# Patient Record
Sex: Female | Born: 1980 | Race: White | Hispanic: No | Smoking: Never smoker
Health system: Southern US, Community
[De-identification: ages and names within clinical notes are randomized; demographics above are authoritative.]

## PROBLEM LIST (undated history)

## (undated) DIAGNOSIS — N920 Excessive and frequent menstruation with regular cycle: Secondary | ICD-10-CM

## (undated) DIAGNOSIS — D649 Anemia, unspecified: Secondary | ICD-10-CM

## (undated) DIAGNOSIS — A599 Trichomoniasis, unspecified: Secondary | ICD-10-CM

## (undated) DIAGNOSIS — A749 Chlamydial infection, unspecified: Secondary | ICD-10-CM

## (undated) HISTORY — DX: Anemia, unspecified: D64.9

## (undated) HISTORY — DX: Excessive and frequent menstruation with regular cycle: N92.0

## (undated) HISTORY — DX: Trichomoniasis, unspecified: A59.9

## (undated) HISTORY — DX: Chlamydial infection, unspecified: A74.9

---

## 2000-05-04 ENCOUNTER — Encounter: Payer: Self-pay | Admitting: *Deleted

## 2000-05-04 ENCOUNTER — Ambulatory Visit (HOSPITAL_COMMUNITY): Admission: RE | Admit: 2000-05-04 | Discharge: 2000-05-04 | Payer: Self-pay | Admitting: *Deleted

## 2000-08-16 ENCOUNTER — Inpatient Hospital Stay (HOSPITAL_COMMUNITY): Admission: RE | Admit: 2000-08-16 | Discharge: 2000-08-18 | Payer: Self-pay | Admitting: *Deleted

## 2002-05-17 ENCOUNTER — Emergency Department (HOSPITAL_COMMUNITY): Admission: EM | Admit: 2002-05-17 | Discharge: 2002-05-17 | Payer: Self-pay | Admitting: Emergency Medicine

## 2004-03-19 ENCOUNTER — Emergency Department (HOSPITAL_COMMUNITY): Admission: EM | Admit: 2004-03-19 | Discharge: 2004-03-19 | Payer: Self-pay | Admitting: Emergency Medicine

## 2005-01-19 ENCOUNTER — Emergency Department (HOSPITAL_COMMUNITY): Admission: EM | Admit: 2005-01-19 | Discharge: 2005-01-19 | Payer: Self-pay | Admitting: Emergency Medicine

## 2006-04-25 ENCOUNTER — Emergency Department (HOSPITAL_COMMUNITY): Admission: EM | Admit: 2006-04-25 | Discharge: 2006-04-25 | Payer: Self-pay | Admitting: Emergency Medicine

## 2006-12-14 ENCOUNTER — Emergency Department (HOSPITAL_COMMUNITY): Admission: EM | Admit: 2006-12-14 | Discharge: 2006-12-14 | Payer: Self-pay | Admitting: Emergency Medicine

## 2007-01-01 ENCOUNTER — Other Ambulatory Visit: Admission: RE | Admit: 2007-01-01 | Discharge: 2007-01-01 | Payer: Self-pay | Admitting: Obstetrics and Gynecology

## 2007-02-07 ENCOUNTER — Ambulatory Visit (HOSPITAL_COMMUNITY): Admission: RE | Admit: 2007-02-07 | Discharge: 2007-02-07 | Payer: Self-pay | Admitting: Obstetrics & Gynecology

## 2007-02-28 ENCOUNTER — Ambulatory Visit (HOSPITAL_COMMUNITY): Admission: RE | Admit: 2007-02-28 | Discharge: 2007-02-28 | Payer: Self-pay | Admitting: Obstetrics & Gynecology

## 2007-04-11 ENCOUNTER — Ambulatory Visit (HOSPITAL_COMMUNITY): Admission: RE | Admit: 2007-04-11 | Discharge: 2007-04-11 | Payer: Self-pay | Admitting: Obstetrics & Gynecology

## 2007-05-09 ENCOUNTER — Ambulatory Visit (HOSPITAL_COMMUNITY): Admission: RE | Admit: 2007-05-09 | Discharge: 2007-05-09 | Payer: Self-pay | Admitting: Obstetrics & Gynecology

## 2007-06-06 ENCOUNTER — Ambulatory Visit (HOSPITAL_COMMUNITY): Admission: RE | Admit: 2007-06-06 | Discharge: 2007-06-06 | Payer: Self-pay | Admitting: Obstetrics & Gynecology

## 2007-06-26 ENCOUNTER — Ambulatory Visit: Payer: Self-pay | Admitting: *Deleted

## 2007-06-26 ENCOUNTER — Inpatient Hospital Stay (HOSPITAL_COMMUNITY): Admission: AD | Admit: 2007-06-26 | Discharge: 2007-06-27 | Payer: Self-pay | Admitting: Obstetrics & Gynecology

## 2007-07-14 ENCOUNTER — Emergency Department (HOSPITAL_COMMUNITY): Admission: EM | Admit: 2007-07-14 | Discharge: 2007-07-14 | Payer: Self-pay | Admitting: Emergency Medicine

## 2007-08-10 ENCOUNTER — Emergency Department (HOSPITAL_COMMUNITY): Admission: EM | Admit: 2007-08-10 | Discharge: 2007-08-10 | Payer: Self-pay | Admitting: Emergency Medicine

## 2008-08-23 IMAGING — CR DG LUMBAR SPINE COMPLETE 4+V
5 series · 5 of 5 positions shown · non-contrast
Comparison: None.

CLINICAL DATA: Low back pain.

LUMBAR SPINE - COMPLETE 4+ VIEW

[view not recorded (1 of 5)]
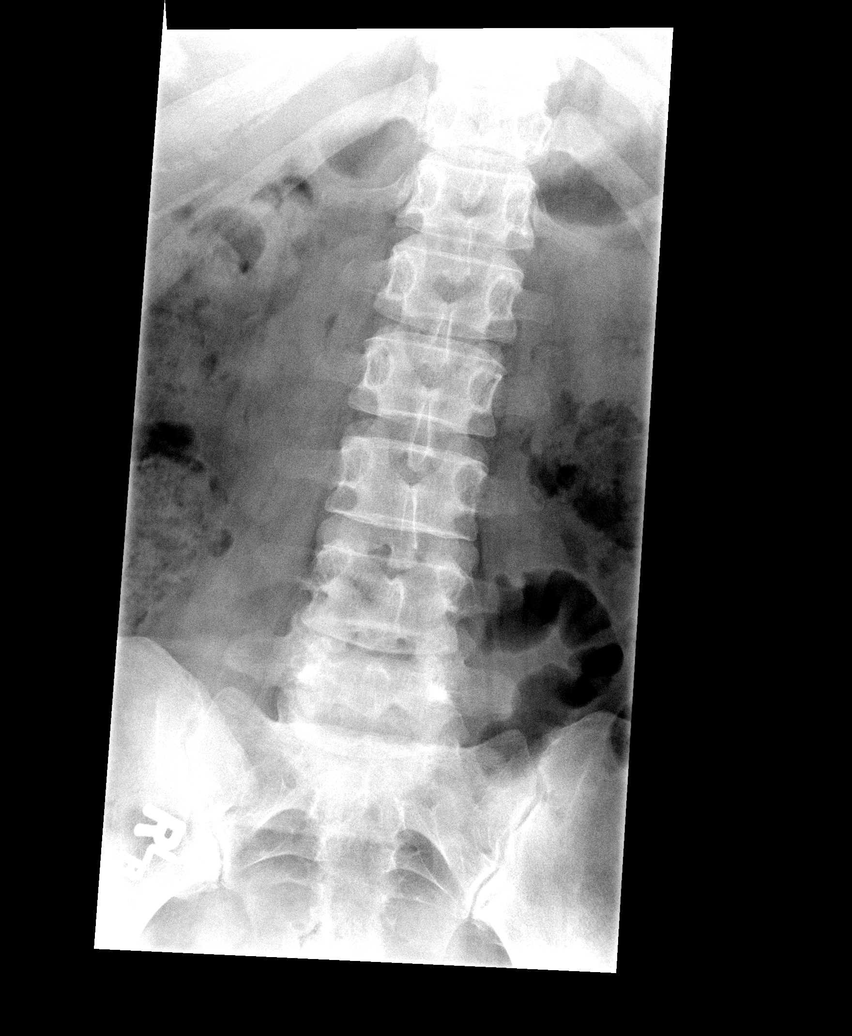

[view not recorded (2 of 5)]
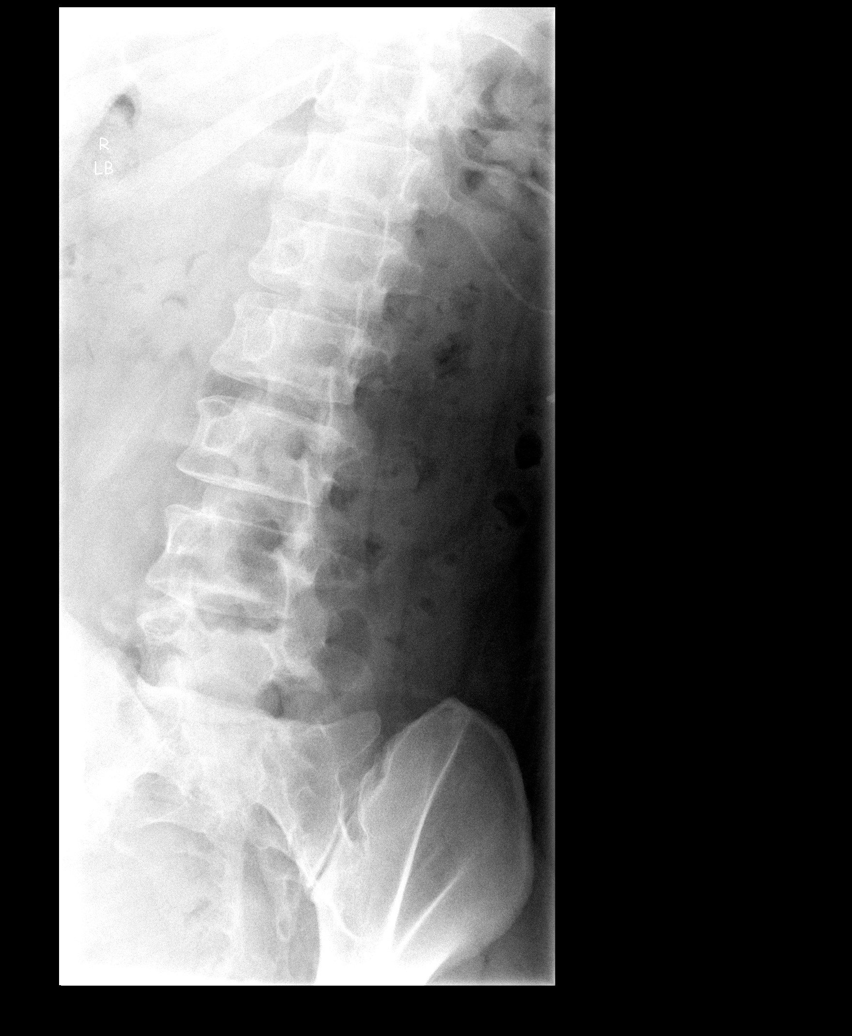

[view not recorded (3 of 5)]
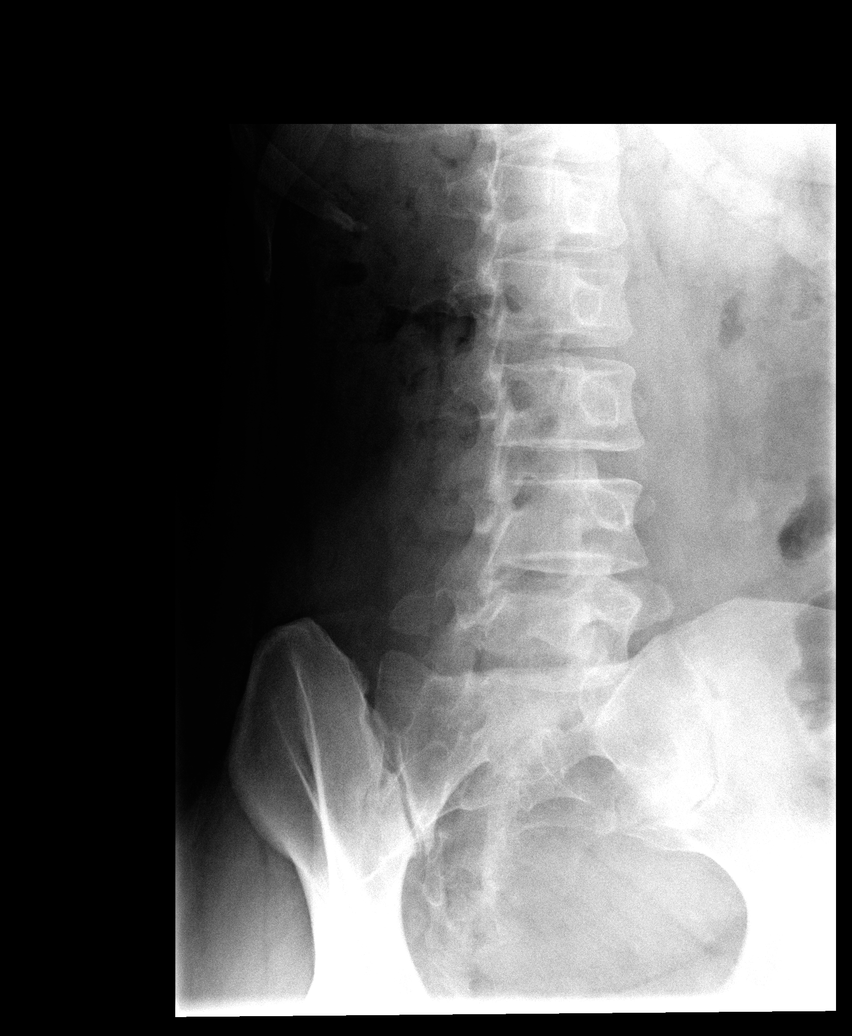

[view not recorded (4 of 5)]
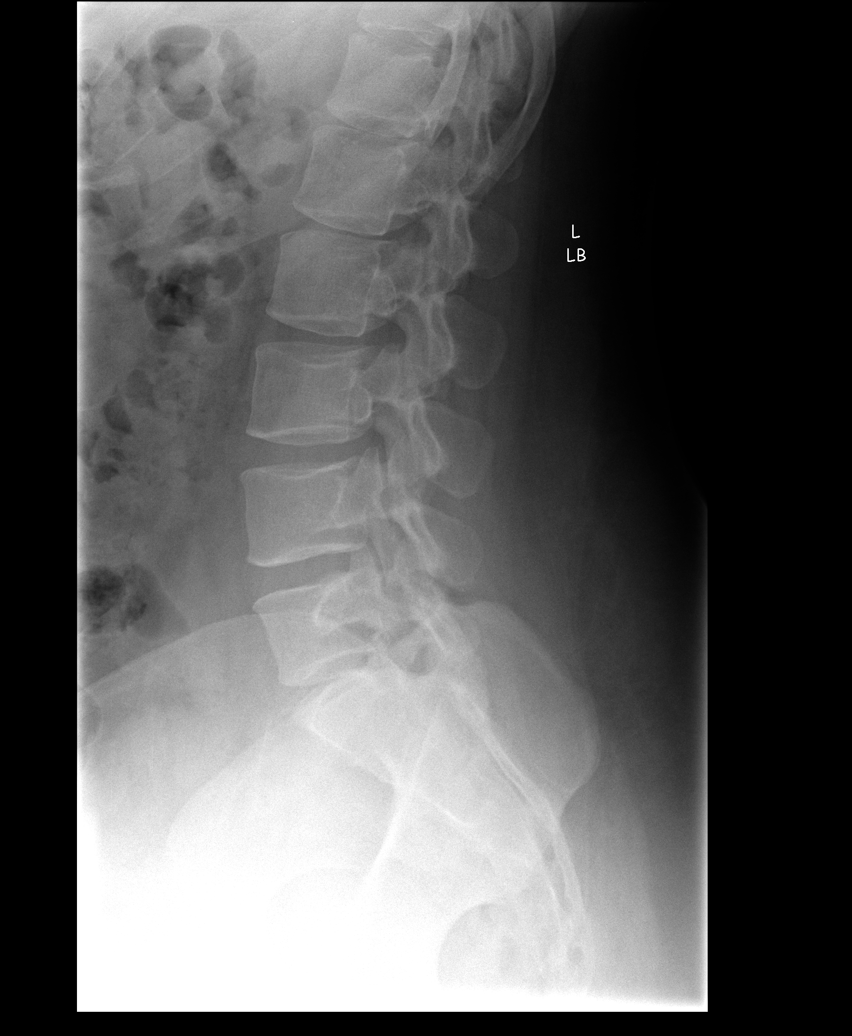

[view not recorded (5 of 5)]
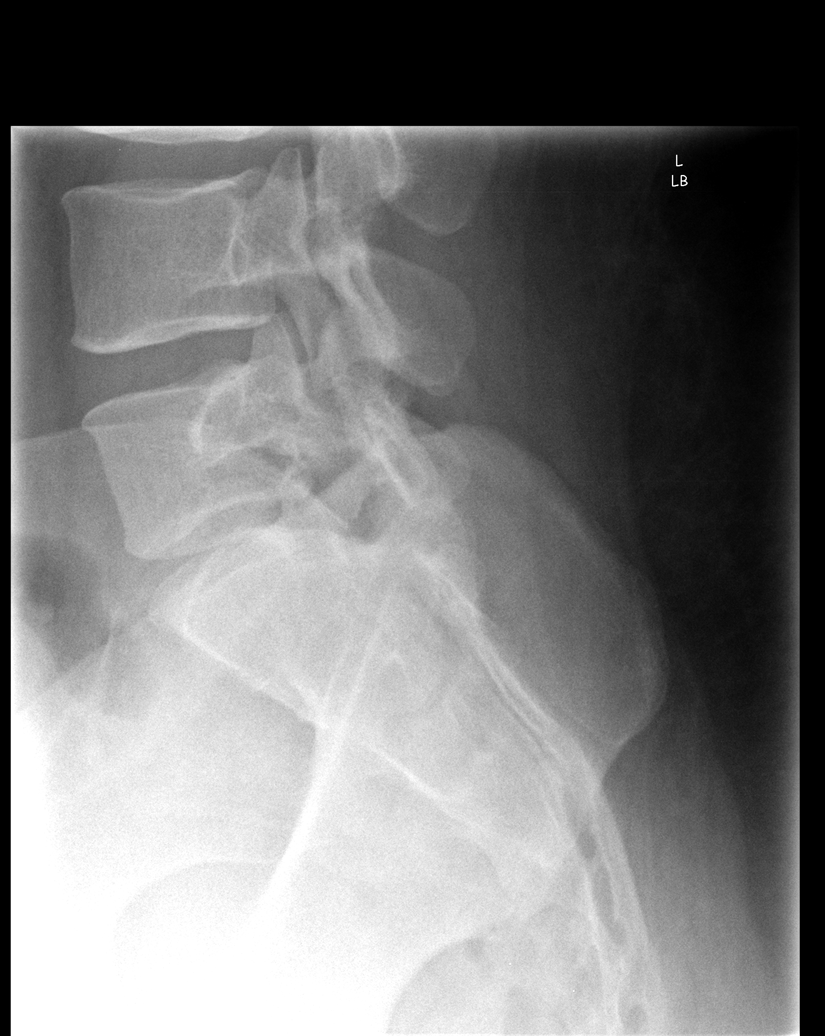

[5 of 5 positions shown; findings below may reference images not displayed]

FINDINGS: There is no evidence of lumbar spine fracture.  Alignment
is normal. No pars defects. Intervertebral disc spaces are
maintained.
IMPRESSION: Negative.

## 2008-11-03 ENCOUNTER — Other Ambulatory Visit: Admission: RE | Admit: 2008-11-03 | Discharge: 2008-11-03 | Payer: Self-pay | Admitting: Obstetrics and Gynecology

## 2010-01-24 ENCOUNTER — Encounter: Payer: Self-pay | Admitting: Obstetrics & Gynecology

## 2010-08-24 ENCOUNTER — Other Ambulatory Visit (HOSPITAL_COMMUNITY)
Admission: RE | Admit: 2010-08-24 | Discharge: 2010-08-24 | Disposition: A | Discharge status: Home / Self Care | Payer: Medicaid Other | Source: Ambulatory Visit | Attending: Obstetrics and Gynecology | Admitting: Obstetrics and Gynecology

## 2010-08-24 ENCOUNTER — Other Ambulatory Visit: Payer: Self-pay | Admitting: Adult Health

## 2010-08-24 DIAGNOSIS — Z01419 Encounter for gynecological examination (general) (routine) without abnormal findings: Secondary | ICD-10-CM | POA: Insufficient documentation

## 2010-08-24 DIAGNOSIS — Z113 Encounter for screening for infections with a predominantly sexual mode of transmission: Secondary | ICD-10-CM | POA: Insufficient documentation

## 2010-09-30 LAB — CBC
Hemoglobin: 11.1 — ABNORMAL LOW
MCHC: 33.5
MCV: 79.4
RBC: 4.16
RDW: 13.9

## 2010-10-01 LAB — BASIC METABOLIC PANEL
Calcium: 9
GFR calc Af Amer: >60
GFR calc non Af Amer: >60
Glucose, Bld: 93
Sodium: 140

## 2010-10-01 LAB — CBC
Hemoglobin: 10.4 — ABNORMAL LOW
RBC: 4.08
RDW: 15.3
WBC: 9.9

## 2010-10-01 LAB — URINALYSIS, ROUTINE W REFLEX MICROSCOPIC
Glucose, UA: NEGATIVE
Hgb urine dipstick: NEGATIVE
Specific Gravity, Urine: 1.015
pH: 7

## 2010-10-01 LAB — POCT CARDIAC MARKERS
CKMB, poc: 1.1
Troponin i, poc: <0.05

## 2010-10-01 LAB — URINE MICROSCOPIC-ADD ON

## 2010-10-01 LAB — DIFFERENTIAL
Basophils Absolute: 0
Lymphocytes Relative: 20
Lymphs Abs: 2
Monocytes Absolute: 0.7
Neutro Abs: 7.1

## 2013-12-17 ENCOUNTER — Other Ambulatory Visit: Payer: Self-pay | Admitting: Obstetrics & Gynecology

## 2013-12-17 ENCOUNTER — Telehealth: Payer: Self-pay | Admitting: Obstetrics & Gynecology

## 2013-12-17 NOTE — Telephone Encounter (Signed)
Pt states last Tuesday had a boil to come up at vaginal area, it is now draining a yellowish discharge. Pt was given an appt for tomorrow with Rodena PietyFran Cresenzo, CNM for 11:30. Pt verbalized understanding.

## 2013-12-18 ENCOUNTER — Ambulatory Visit (INDEPENDENT_AMBULATORY_CARE_PROVIDER_SITE_OTHER): Payer: Self-pay | Admitting: Advanced Practice Midwife

## 2013-12-18 ENCOUNTER — Encounter: Payer: Self-pay | Admitting: Advanced Practice Midwife

## 2013-12-18 VITALS — BP 136/64 | Ht 67.0 in | Wt 272.0 lb

## 2013-12-18 DIAGNOSIS — N898 Other specified noninflammatory disorders of vagina: Secondary | ICD-10-CM

## 2013-12-18 DIAGNOSIS — N7689 Other specified inflammation of vagina and vulva: Secondary | ICD-10-CM

## 2013-12-18 NOTE — Progress Notes (Signed)
[facility] Visit  Patient name: Anna Edwards MRN [id number]  Date of birth: 1980/08/19  CC & HPI:  Anna Edwards is a 33 y.o. Caucasian female presenting today for c/o vaginal boil since last week.  2 days ago, it ruptured, feels much better  Pertinent History Reviewed:  Medical & Surgical Hx:   History reviewed. No pertinent past medical history. History reviewed. No pertinent past surgical history. Medications: Reviewed & Updated - see associated section Social History: Reviewed -  reports that she has never smoked. She does not have any smokeless tobacco history on file.  Objective Findings:  Vitals: BP 136/64 mmHg  Ht 5\' 7"  (1.702 m)  Wt 272 lb (123.378 kg)  BMI 42.59 kg/m2  LMP 11/26/2013  Physical Examination: General appearance - alert, well appearing, and in no distress Mental status - alert, oriented to person, place, and time Pelvic:  Left vulva, evidence of ruptured boil.  No erythema, no more drainage  No results found for this or any previous visit (from the past 24 hour(s)).   Assessment & Plan:  A:   Ruptured vaginal boil, healing well P:  Discussed treatment options if happens again   F/U prn   CRESENZO-DISHMAN,FRANCES CNM 12/18/2013 11:56 AM

## 2013-12-24 ENCOUNTER — Other Ambulatory Visit: Payer: Self-pay | Admitting: Obstetrics & Gynecology

## 2014-01-07 ENCOUNTER — Other Ambulatory Visit (HOSPITAL_COMMUNITY)
Admission: RE | Admit: 2014-01-07 | Discharge: 2014-01-07 | Disposition: A | Discharge status: Home / Self Care | Payer: Medicaid Other | Source: Ambulatory Visit | Attending: Adult Health | Admitting: Adult Health

## 2014-01-07 ENCOUNTER — Other Ambulatory Visit: Payer: Self-pay | Admitting: Women's Health

## 2014-01-07 ENCOUNTER — Ambulatory Visit (INDEPENDENT_AMBULATORY_CARE_PROVIDER_SITE_OTHER): Payer: Medicaid Other | Admitting: Adult Health

## 2014-01-07 ENCOUNTER — Encounter: Payer: Self-pay | Admitting: Adult Health

## 2014-01-07 VITALS — BP 126/70 | HR 78 | Ht 67.25 in | Wt 272.0 lb

## 2014-01-07 DIAGNOSIS — N920 Excessive and frequent menstruation with regular cycle: Secondary | ICD-10-CM

## 2014-01-07 DIAGNOSIS — Z308 Encounter for other contraceptive management: Secondary | ICD-10-CM

## 2014-01-07 DIAGNOSIS — Z01419 Encounter for gynecological examination (general) (routine) without abnormal findings: Secondary | ICD-10-CM | POA: Insufficient documentation

## 2014-01-07 DIAGNOSIS — Z1151 Encounter for screening for human papillomavirus (HPV): Secondary | ICD-10-CM | POA: Diagnosis present

## 2014-01-07 DIAGNOSIS — D500 Iron deficiency anemia secondary to blood loss (chronic): Secondary | ICD-10-CM

## 2014-01-07 DIAGNOSIS — Z113 Encounter for screening for infections with a predominantly sexual mode of transmission: Secondary | ICD-10-CM | POA: Insufficient documentation

## 2014-01-07 DIAGNOSIS — Z3009 Encounter for other general counseling and advice on contraception: Secondary | ICD-10-CM

## 2014-01-07 DIAGNOSIS — D649 Anemia, unspecified: Secondary | ICD-10-CM | POA: Insufficient documentation

## 2014-01-07 DIAGNOSIS — Z30011 Encounter for initial prescription of contraceptive pills: Secondary | ICD-10-CM

## 2014-01-07 HISTORY — DX: Anemia, unspecified: D64.9

## 2014-01-07 HISTORY — DX: Excessive and frequent menstruation with regular cycle: N92.0

## 2014-01-07 MED ORDER — NORETHIN ACE-ETH ESTRAD-FE 1-20 MG-MCG(24) PO CHEW: 1.0000 | CHEWABLE_TABLET | Freq: Every day | ORAL | Status: DC

## 2014-01-07 NOTE — Patient Instructions (Signed)
Menorrhagia Menorrhagia is the medical term for when your menstrual periods are heavy or last longer than usual. With menorrhagia, every period you have may cause enough blood loss and cramping that you are unable to maintain your usual activities. CAUSES  In some cases, the cause of heavy periods is unknown, but a number of conditions may cause menorrhagia. Common causes include:  A problem with the hormone-producing thyroid gland (hypothyroid).  Noncancerous growths in the uterus (polyps or fibroids).  An imbalance of the estrogen and progesterone hormones.  One of your ovaries not releasing an egg during one or more months.  Side effects of having an intrauterine device (IUD).  Side effects of some medicines, such as anti-inflammatory medicines or blood thinners.  A bleeding disorder that stops your blood from clotting normally. SIGNS AND SYMPTOMS  During a normal period, bleeding lasts between 4 and 8 days. Signs that your periods are too heavy include:  You routinely have to change your pad or tampon every 1 or 2 hours because it is completely soaked.  You pass blood clots larger than 1 inch (2.5 cm) in size.  You have bleeding for more than 7 days.  You need to use pads and tampons at the same time because of heavy bleeding.  You need to wake up to change your pads or tampons during the night.  You have symptoms of anemia, such as tiredness, fatigue, or shortness of breath. DIAGNOSIS  Your health care provider will perform a physical exam and ask you questions about your symptoms and menstrual history. Other tests may be ordered based on what the health care provider finds during the exam. These tests can include:  Blood tests. Blood tests are used to check if you are pregnant or have hormonal changes, a bleeding or thyroid disorder, low iron levels (anemia), or other problems.  Endometrial biopsy. Your health care provider takes a sample of tissue from the inside of your  uterus to be examined under a microscope.  Pelvic ultrasound. This test uses sound waves to make a picture of your uterus, ovaries, and vagina. The pictures can show if you have fibroids or other growths.  Hysteroscopy. For this test, your health care provider will use a small telescope to look inside your uterus. Based on the results of your initial tests, your health care provider may recommend further testing. TREATMENT  Treatment may not be needed. If it is needed, your health care provider may recommend treatment with one or more medicines first. If these do not reduce bleeding enough, a surgical treatment might be an option. The best treatment for you will depend on:   Whether you need to prevent pregnancy.  Your desire to have children in the future.  The cause and severity of your bleeding.  Your opinion and personal preference.  Medicines for menorrhagia may include:  Birth control methods that use hormones. These include the pill, skin patch, vaginal ring, shots that you get every 3 months, hormonal IUD, and implant. These treatments reduce bleeding during your menstrual period.  Medicines that thicken blood and slow bleeding.  Medicines that reduce swelling, such as ibuprofen.  Medicines that contain a synthetic hormone called progestin.   Medicines that make the ovaries stop working for a short time.  You may need surgical treatment for menorrhagia if the medicines are unsuccessful. Treatment options include:  Dilation and curettage (D&C). In this procedure, your health care provider opens (dilates) your cervix and then scrapes or suctions tissue from   the lining of your uterus to reduce menstrual bleeding.  Operative hysteroscopy. This procedure uses a tiny tube with a light (hysteroscope) to view your uterine cavity and can help in the surgical removal of a polyp that may be causing heavy periods.  Endometrial ablation. Through various techniques, your health care  provider permanently destroys the entire lining of your uterus (endometrium). After endometrial ablation, most women have little or no menstrual flow. Endometrial ablation reduces your ability to become pregnant.  Endometrial resection. This surgical procedure uses an electrosurgical wire loop to remove the lining of the uterus. This procedure also reduces your ability to become pregnant.  Hysterectomy. Surgical removal of the uterus and cervix is a permanent procedure that stops menstrual periods. Pregnancy is not possible after a hysterectomy. This procedure requires anesthesia and hospitalization. HOME CARE INSTRUCTIONS   Only take over-the-counter or prescription medicines as directed by your health care provider. Take prescribed medicines exactly as directed. Do not change or switch medicines without consulting your health care provider.  Take any prescribed iron pills exactly as directed by your health care provider. Long-term heavy bleeding may result in low iron levels. Iron pills help replace the iron your body lost from heavy bleeding. Iron may cause constipation. If this becomes a problem, increase the bran, fruits, and roughage in your diet.  Do not take aspirin or medicines that contain aspirin 1 week before or during your menstrual period. Aspirin may make the bleeding worse.  If you need to change your sanitary pad or tampon more than once every 2 hours, stay in bed and rest as much as possible until the bleeding stops.  Eat well-balanced meals. Eat foods high in iron. Examples are leafy green vegetables, meat, liver, eggs, and whole grain breads and cereals. Do not try to lose weight until the abnormal bleeding has stopped and your blood iron level is back to normal. SEEK MEDICAL CARE IF:   You soak through a pad or tampon every 1 or 2 hours, and this happens every time you have a period.  You need to use pads and tampons at the same time because you are bleeding so much.  You  need to change your pad or tampon during the night.  You have a period that lasts for more than 8 days.  You pass clots bigger than 1 inch wide.  You have irregular periods that happen more or less often than once a month.  You feel dizzy or faint.  You feel very weak or tired.  You feel short of breath or feel your heart is beating too fast when you exercise.  You have nausea and vomiting or diarrhea while you are taking your medicine.  You have any problems that may be related to the medicine you are taking. SEEK IMMEDIATE MEDICAL CARE IF:   You soak through 4 or more pads or tampons in 2 hours.  You have any bleeding while you are pregnant. MAKE SURE YOU:   Understand these instructions.  Will watch your condition.  Will get help right away if you are not doing well or get worse. Document Released: 12/20/2004 Document Revised: 12/25/2012 Document Reviewed: 06/10/2012 [facility] Patient Information 2015 [place], [place]. This information is not intended to replace advice given to you by your health care provider. Make sure you discuss any questions you have with your health care provider. Start OCs with next period  Take Iron return in 3 months for follow up Physical in 1 year Iron Deficiency  Anemia Anemia is a condition in which there are less red blood cells or hemoglobin in the blood than normal. Hemoglobin is the part of red blood cells that carries oxygen. Iron deficiency anemia is anemia caused by too little iron. It is the most common type of anemia. It may leave you tired and short of breath. CAUSES   Lack of iron in the diet.  Poor absorption of iron, as seen with intestinal disorders.  Intestinal bleeding.  Heavy periods. SIGNS AND SYMPTOMS  Mild anemia may not be noticeable. Symptoms may include:  Fatigue.  Headache.  Pale skin.  Weakness.  Tiredness.  Shortness of breath.  Dizziness.  Cold hands and feet.  Fast or irregular  heartbeat. DIAGNOSIS  Diagnosis requires a thorough evaluation and physical exam by your health care provider. Blood tests are generally used to confirm iron deficiency anemia. Additional tests may be done to find the underlying cause of your anemia. These may include:  Testing for blood in the stool (fecal occult blood test).  A procedure to see inside the colon and rectum (colonoscopy).  A procedure to see inside the esophagus and stomach (endoscopy). TREATMENT  Iron deficiency anemia is treated by correcting the cause of the deficiency. Treatment may involve:  Adding iron-rich foods to your diet.  Taking iron supplements. Pregnant or breastfeeding women need to take extra iron because their normal diet usually does not provide the required amount.  Taking vitamins. Vitamin C improves the absorption of iron. Your health care provider may recommend that you take your iron tablets with a glass of orange juice or vitamin C supplement.  Medicines to make heavy menstrual flow lighter.  Surgery. HOME CARE INSTRUCTIONS   Take iron as directed by your health care provider.  If you cannot tolerate taking iron supplements by mouth, talk to your health care provider about taking them through a vein (intravenously) or an injection into a muscle.  For the best iron absorption, iron supplements should be taken on an empty stomach. If you cannot tolerate them on an empty stomach, you may need to take them with food.  Do not drink milk or take antacids at the same time as your iron supplements. Milk and antacids may interfere with the absorption of iron.  Iron supplements can cause constipation. Make sure to include fiber in your diet to prevent constipation. A stool softener may also be recommended.  Take vitamins as directed by your health care provider.  Eat a diet rich in iron. Foods high in iron include liver, lean beef, whole-grain bread, eggs, dried fruit, and dark green leafy  vegetables. SEEK IMMEDIATE MEDICAL CARE IF:   You faint. If this happens, do not drive. Call your local emergency services (911 in [place] if no other help is available.  You have chest pain.  You feel nauseous or vomit.  You have severe or increased shortness of breath with activity.  You feel weak.  You have a rapid heartbeat.  You have unexplained sweating.  You become light-headed when getting up from a chair or bed. MAKE SURE YOU:   Understand these instructions.  Will watch your condition.  Will get help right away if you are not doing well or get worse. Document Released: 12/18/1999 Document Revised: 12/25/2012 Document Reviewed: 08/27/2012 [facility] Patient Information 2015 [place], [place]. This information is not intended to replace advice given to you by your health care provider. Make sure you discuss any questions you have with your health care provider.

## 2014-01-07 NOTE — Progress Notes (Signed)
Patient ID: Anna Edwards, female   DOB: Mar 07, 1980, 34 y.o.   MRN: [id number] History of Present Illness: Anna Edwards is a 34 year old white female in for pap and physical and complains of heavy periods and clots and tired during period,heavy first 3-4 days and they last 7 days.Has missed work, and this is going on for greater than 6 months.She is using withdrawal and wants birth control.She has family planning medicaid.   Current Medications, Allergies, Past Medical History, Past Surgical History, Family History and Social History were reviewed in [facility] electronic medical record.     Review of Systems: Patient denies any headaches, blurred vision, shortness of breath, chest pain, abdominal pain, problems with bowel movements, urination, or intercourse. No joint pain or mood swings, see HPI for positives.    Physical Exam:BP 126/70 mmHg  Pulse 78  Ht 5' 7.25" (1.708 m)  Wt 272 lb (123.378 kg)  BMI 42.29 kg/m2  LMP 12/19/2015Fingerstick HGB 8.9 General:  Well developed, well nourished, no acute distress Skin:  Warm and dry Neck:  Midline trachea, normal thyroid Lungs; Clear to auscultation bilaterally Breast:  No dominant palpable mass, retraction, or nipple discharge Cardiovascular: Regular rate and rhythm Abdomen:  Soft, non tender, no hepatosplenomegaly Pelvic:  External genitalia is normal in appearance.  The vagina is normal in appearance. The cervix is bulbous.Pap with HPV and GC/CHL performed.  Uterus is felt to be normal size, shape, and contour.  No   adnexal masses or tenderness noted. Extremities:  No swelling or varicosities noted Psych:  No mood changes,alert and cooperative,seems happy Discussed trying OCs to control periods and for birth control and she agrees, will also add Iron.  Impression: Well woman exam with pap Menorrhagia with regular cycles Anemia from chronic blood loss Contraceptive management   Plan: Rx minastrin,disp 1 pack, take 1 daily start  with next period,refilled x 11 Take OTC iron and eat red meat and spinach Return in 3 months for follow up or before if needed Physical in 1 year Review handout on menorrhagia and anemia  Check HIV,RPR,HSV 2

## 2014-01-08 LAB — HIV ANTIBODY (ROUTINE TESTING W REFLEX): HIV 1&2 Ab, 4th Generation: NONREACTIVE

## 2014-01-08 LAB — RPR

## 2014-01-09 ENCOUNTER — Telehealth: Payer: Self-pay | Admitting: Adult Health

## 2014-01-09 LAB — CYTOLOGY - PAP

## 2014-01-09 LAB — HSV 2 ANTIBODY, IGG

## 2014-01-09 MED ORDER — AZITHROMYCIN 500 MG PO TABS: ORAL_TABLET | ORAL | Status: DC

## 2014-01-09 NOTE — Telephone Encounter (Signed)
Pt aware of positive chlamydia, will rx azithromycin 500 mg #2 take 2 po now,No sex, Proof of treatment 2/4 at 4 pm,[facility] sent, Pt will talk with partner and let me know in am if I need to treat or he will go to clinic, he is now in town.She is aware pap was normal with negative HPV and GC and that HIV, RPR and HSV 2 were all negative.

## 2014-01-09 NOTE — Telephone Encounter (Signed)
Left message to call me about pap 

## 2014-01-10 ENCOUNTER — Telehealth: Payer: Self-pay | Admitting: *Deleted

## 2014-01-10 NOTE — Telephone Encounter (Signed)
Pt called with names of partners to be treated for chlamydia, Anna Edwards [birth year] [facility] and chris spivey [birth year] [facility] [place] [phone] will rx azithromycin 500 mg #2 2 po now for each and told pt to tell them that they need to tell other partners that they need to be treated

## 2014-02-06 ENCOUNTER — Ambulatory Visit (INDEPENDENT_AMBULATORY_CARE_PROVIDER_SITE_OTHER): Payer: Self-pay | Admitting: Adult Health

## 2014-02-06 ENCOUNTER — Encounter: Payer: Self-pay | Admitting: Adult Health

## 2014-02-06 VITALS — BP 130/64 | Ht 67.0 in | Wt 275.0 lb

## 2014-02-06 DIAGNOSIS — A749 Chlamydial infection, unspecified: Secondary | ICD-10-CM | POA: Insufficient documentation

## 2014-02-06 NOTE — Patient Instructions (Signed)
Return as scheduled 

## 2014-02-06 NOTE — Progress Notes (Signed)
Subjective:     Patient ID: Anna Edwards, female   DOB: Feb 10, 1980, 34 y.o.   MRN: [id number]  HPI Anna Edwards is a 34 year old white female in for proof of treatment for recent + chlamydia.She and her partner took their meds and she has not had sex.No discharge or discomfort.When she was seen last had menorrhagia and started on OCs and was anemic and is taking OTC iron and feels better.  Review of Systems  All other systems negative Reviewed past medical,surgical, social and family history. Reviewed medications and allergies.     Objective:   Physical Exam BP 130/64 mmHg  Ht 5\' 7"  (1.702 m)  Wt 275 lb (124.739 kg)  BMI 43.06 kg/m2  LMP 01/18/2014   GC/CHL sent, She says she is feeling better, to continue OCs and iron and follow up as scheduled, and she now has only one partner.  Assessment:     Proof of treatment for recent + chlamydia    Plan:    Use condoms Follow up as scheduled GC/CHL sent

## 2014-02-09 LAB — GC/CHLAMYDIA PROBE AMP
Chlamydia trachomatis, NAA: NEGATIVE
NEISSERIA GONORRHOEAE BY PCR: NEGATIVE

## 2014-02-10 ENCOUNTER — Telehealth: Payer: Self-pay | Admitting: Adult Health

## 2014-02-10 NOTE — Telephone Encounter (Signed)
Left message tests negative 

## 2014-02-13 ENCOUNTER — Telehealth: Payer: Self-pay | Admitting: Adult Health

## 2014-02-13 MED ORDER — NORETHIN ACE-ETH ESTRAD-FE 1-20 MG-MCG(24) PO CHEW: 1.0000 | CHEWABLE_TABLET | Freq: Every day | ORAL | Status: DC

## 2014-02-13 NOTE — Telephone Encounter (Signed)
[name] said [facility], [facility], will transfer Rx to there

## 2014-04-10 ENCOUNTER — Ambulatory Visit: Payer: Medicaid Other | Admitting: Adult Health

## 2014-12-18 ENCOUNTER — Other Ambulatory Visit: Payer: Self-pay | Admitting: Adult Health

## 2015-01-13 ENCOUNTER — Ambulatory Visit (INDEPENDENT_AMBULATORY_CARE_PROVIDER_SITE_OTHER): Payer: Medicaid Other | Admitting: Adult Health

## 2015-01-13 ENCOUNTER — Encounter: Payer: Self-pay | Admitting: Adult Health

## 2015-01-13 VITALS — BP 120/80 | HR 80 | Ht 67.0 in | Wt 275.0 lb

## 2015-01-13 DIAGNOSIS — Z309 Encounter for contraceptive management, unspecified: Secondary | ICD-10-CM

## 2015-01-13 DIAGNOSIS — Z01419 Encounter for gynecological examination (general) (routine) without abnormal findings: Secondary | ICD-10-CM

## 2015-01-13 DIAGNOSIS — Z3041 Encounter for surveillance of contraceptive pills: Secondary | ICD-10-CM

## 2015-01-13 MED ORDER — NORETHIN ACE-ETH ESTRAD-FE 1-20 MG-MCG(24) PO CHEW: CHEWABLE_TABLET | ORAL | Status: DC

## 2015-01-13 NOTE — Progress Notes (Signed)
Patient ID: Anna Edwards, female   DOB: Feb 26, 1980, 35 y.o.   MRN: [id number] History of Present Illness: Anna Edwards is a 35 year old white female, in for well woman gyn exam,she had a normal pap 01/07/14. She is happy with her minastrin, periods lighter, occasional BTB.   Current Medications, Allergies, Past Medical History, Past Surgical History, Family History and Social History were reviewed in [facility] electronic medical record.     Review of Systems: Patient denies any headaches, hearing loss, fatigue, blurred vision, shortness of breath, chest pain, abdominal pain, problems with bowel movements, urination, or intercourse. No joint pain or mood swings.See HPI for positives.    Physical Exam:BP 120/80 mmHg  Pulse 80  Ht 5\' 7"  (1.702 m)  Wt 275 lb (124.739 kg)  BMI 43.06 kg/m2  LMP 12/09/2014 General:  Well developed, well nourished, no acute distress Skin:  Warm and dry,tan Neck:  Midline trachea, normal thyroid, good ROM, no lymphadenopathy Lungs; Clear to auscultation bilaterally Breast:  No dominant palpable mass, retraction, or nipple discharge Cardiovascular: Regular rate and rhythm Abdomen:  Soft, non tender, no hepatosplenomegaly Pelvic:  External genitalia is normal in appearance, no lesions.  The vagina is normal in appearance. Urethra has no lesions or masses. The cervix is smooth.  Uterus is felt to be normal size, shape, and contour.  No adnexal masses or tenderness noted.Bladder is non tender, no masses felt. Extremities/musculoskeletal:  No swelling or varicosities noted, no clubbing or cyanosis Psych:  No mood changes, alert and cooperative,seems happy   Impression: Well woman gyn exam no pap Contraceptive management     Plan: GC/CHL sent on urine Check HIV,RPR,HSV2  Refilled minastrin #1 pack take 1 daily with 12 refills Physical in 1 year

## 2015-01-13 NOTE — Patient Instructions (Signed)
Physical in 1 year 

## 2015-01-14 LAB — HIV ANTIBODY (ROUTINE TESTING W REFLEX): HIV Screen 4th Generation wRfx: NONREACTIVE

## 2015-01-14 LAB — RPR: RPR: NONREACTIVE

## 2015-01-14 LAB — GC/CHLAMYDIA PROBE AMP
CHLAMYDIA, DNA PROBE: NEGATIVE
Neisseria gonorrhoeae by PCR: NEGATIVE

## 2015-01-14 LAB — HSV 2 ANTIBODY, IGG

## 2015-12-17 ENCOUNTER — Other Ambulatory Visit: Payer: Self-pay | Admitting: Adult Health

## 2016-01-19 ENCOUNTER — Ambulatory Visit (INDEPENDENT_AMBULATORY_CARE_PROVIDER_SITE_OTHER): Payer: Medicaid Other | Admitting: Adult Health

## 2016-01-19 ENCOUNTER — Encounter: Payer: Self-pay | Admitting: Adult Health

## 2016-01-19 VITALS — BP 120/55 | HR 67 | Ht 67.25 in | Wt 272.5 lb

## 2016-01-19 DIAGNOSIS — Z01419 Encounter for gynecological examination (general) (routine) without abnormal findings: Secondary | ICD-10-CM

## 2016-01-19 DIAGNOSIS — Z308 Encounter for other contraceptive management: Secondary | ICD-10-CM | POA: Diagnosis not present

## 2016-01-19 DIAGNOSIS — Z3009 Encounter for other general counseling and advice on contraception: Secondary | ICD-10-CM

## 2016-01-19 DIAGNOSIS — Z3041 Encounter for surveillance of contraceptive pills: Secondary | ICD-10-CM

## 2016-01-19 MED ORDER — NORETHIN ACE-ETH ESTRAD-FE 1-20 MG-MCG(24) PO CHEW: CHEWABLE_TABLET | ORAL | 12 refills | Status: DC

## 2016-01-19 NOTE — Patient Instructions (Signed)
Physical in 1 year 

## 2016-01-19 NOTE — Progress Notes (Signed)
Patient ID: Anna Edwards, female   DOB: [birth year], 36 y.o.   MRN: [id number] History of Present Illness: Anna Edwards is a 36 year old white female in for well woman gyn exam, she had normal pap with negative HPV 01/07/14. No complaints, has family planning medicaid.    Current Medications, Allergies, Past Medical History, Past Surgical History, Family History and Social History were reviewed in [facility] electronic medical record.     Review of Systems: Patient denies any headaches, hearing loss, fatigue, blurred vision, shortness of breath, chest pain, abdominal pain, problems with bowel movements, urination, or intercourse. No joint pain or mood swings.    Physical Exam:BP (!) 120/55 (BP Location: Left Arm, Patient Position: Sitting, Cuff Size: Large)   Pulse 67   Ht 5' 7.25" (1.708 m)   Wt 272 lb 8 oz (123.6 kg)   LMP 12/14/2015   BMI 42.36 kg/m  General:  Well developed, well nourished, no acute distress Skin:  Warm and dry,tan Neck:  Midline trachea, normal thyroid, good ROM, no lymphadenopathy Lungs; Clear to auscultation bilaterally Breast:  No dominant palpable mass, retraction, or nipple discharge Cardiovascular: Regular rate and rhythm Abdomen:  Soft, non tender, no hepatosplenomegaly Pelvic:  External genitalia is normal in appearance, no lesions.  The vagina is normal in appearance. Urethra has no lesions or masses. The cervix is smooth, CG/CHL obtained.  Uterus is felt to be normal size, shape, and contour.  No adnexal masses or tenderness noted.Bladder is non tender, no masses felt. Extremities/musculoskeletal:  No swelling or varicosities noted, no clubbing or cyanosis Psych:  No mood changes, alert and cooperative,seems happy PHQ 2 score 0.  Impression: 1. Well woman exam with routine gynecological exam   2. Encounter for surveillance of contraceptive pills   3. Family planning       Plan: Check GC/CHL Check HIV and RPR Meds ordered this encounter   Medications  . Norethin Ace-Eth Estrad-FE (MIBELAS 24 FE) 1-20 MG-MCG(24) CHEW    Sig: CHEW ONE TABLET BY MOUTH DAILY.    Dispense:  28 tablet    Refill:  12    Order Specific Question:   Supervising Provider    Answer:   Anna Edwards [[id number]]  Physical in 1 year Pap in 2019

## 2016-01-20 LAB — RPR: RPR Ser Ql: NONREACTIVE

## 2016-01-20 LAB — GC/CHLAMYDIA PROBE AMP
Chlamydia trachomatis, NAA: NEGATIVE
Neisseria gonorrhoeae by PCR: NEGATIVE

## 2016-01-20 LAB — HIV ANTIBODY (ROUTINE TESTING W REFLEX): HIV SCREEN 4TH GENERATION: NONREACTIVE

## 2017-01-12 ENCOUNTER — Emergency Department (HOSPITAL_COMMUNITY)
Admission: EM | Admit: 2017-01-12 | Discharge: 2017-01-12 | Disposition: A | Discharge status: Home / Self Care | Payer: Self-pay | Attending: Emergency Medicine | Admitting: Emergency Medicine

## 2017-01-12 ENCOUNTER — Encounter (HOSPITAL_COMMUNITY): Payer: Self-pay | Admitting: Emergency Medicine

## 2017-01-12 ENCOUNTER — Other Ambulatory Visit: Payer: Self-pay

## 2017-01-12 DIAGNOSIS — D649 Anemia, unspecified: Secondary | ICD-10-CM | POA: Insufficient documentation

## 2017-01-12 DIAGNOSIS — R2231 Localized swelling, mass and lump, right upper limb: Secondary | ICD-10-CM | POA: Insufficient documentation

## 2017-01-12 DIAGNOSIS — Z79899 Other long term (current) drug therapy: Secondary | ICD-10-CM | POA: Insufficient documentation

## 2017-01-12 DIAGNOSIS — L02411 Cutaneous abscess of right axilla: Secondary | ICD-10-CM | POA: Insufficient documentation

## 2017-01-12 MED ORDER — HYDROCODONE-ACETAMINOPHEN 5-325 MG PO TABS: ORAL_TABLET | ORAL | 0 refills | Status: AC

## 2017-01-12 MED ORDER — SULFAMETHOXAZOLE-TRIMETHOPRIM 800-160 MG PO TABS: 1.0000 | ORAL_TABLET | Freq: Two times a day (BID) | ORAL | 0 refills | Status: AC

## 2017-01-12 MED ORDER — MUPIROCIN CALCIUM 2 % NA OINT: TOPICAL_OINTMENT | NASAL | 0 refills | Status: AC

## 2017-01-12 MED ORDER — LIDOCAINE-EPINEPHRINE (PF) 2 %-1:200000 IJ SOLN
10.0000 mL | Freq: Once | INTRAMUSCULAR | Status: AC
  Administered 2017-01-12: 10 mL via INTRADERMAL
  Filled 2017-01-12: qty 20

## 2017-01-12 NOTE — ED Provider Notes (Signed)
[facility]   CSN: [id number] Arrival date & time: 01/12/17  1020     History   Chief Complaint Chief Complaint  Patient presents with  . Abscess    HPI Anna Edwards Anna Edwards is a 37 y.o. female.  HPI   Anna Edwards Anna Edwards is a 37 y.o. female who presents to the Emergency Department complaining of pain redness and swelling of the right axilla.  She states that she noticed symptoms 3 days ago.  Initially, she states the area was small in size but has continued to increase in both pain and size.  She denies any fever or drainage.  She also reports having any smaller "pimple" to her left lower abdomen that she has squeezed and now noticed pain and darkish black discoloration to the area.  She states she was seen at the family practice center and was advised to come here for further evaluation.  She denies history of diabetes or MRSA.  Axilla symptoms are worse with movement of the right arm.  She denies fever chills or neck pain.  She has applied some warm compresses without relief.  Past Medical History:  Diagnosis Date  . Anemia 01/07/2014  . Chlamydia   . Menorrhagia with regular cycle 01/07/2014    Patient Active Problem List   Diagnosis Date Noted  . Chlamydia 02/06/2014  . Menorrhagia with regular cycle 01/07/2014  . Anemia 01/07/2014    History reviewed. No pertinent surgical history.  OB History    Gravida Para Term Preterm AB Living   2 2 2     2    SAB TAB Ectopic Multiple Live Births           2       Home Medications    Prior to Admission medications   Medication Sig Start Date End Date Taking? Authorizing Provider  Norethin Ace-Eth Estrad-FE (MIBELAS 24 FE) 1-20 MG-MCG(24) CHEW CHEW ONE TABLET BY MOUTH DAILY. 01/19/16   Adline PotterGriffin, Jennifer A, NP    Family History Family History  Problem Relation Age of Onset  . Asthma Mother   . COPD Mother   . Hypertension Mother   . Other Brother        sleep apnea  . Cancer Maternal  Grandmother        breast  . Other Paternal Grandfather        had a pacemaker  . Cancer Paternal Grandfather        lung    Social History Social History   Tobacco Use  . Smoking status: Never Smoker  . Smokeless tobacco: Never Used  Substance Use Topics  . Alcohol use: No    Alcohol/week: 0.0 oz  . Drug use: No     Allergies   Patient has no known allergies.   Review of Systems Review of Systems  Constitutional: Negative for chills and fever.  Cardiovascular: Negative for chest pain.  Gastrointestinal: Negative for nausea and vomiting.  Musculoskeletal: Negative for arthralgias, joint swelling and neck pain.  Skin: Positive for color change.       Abscess   Hematological: Negative for adenopathy.  All other systems reviewed and are negative.    Physical Exam Updated Vital Signs BP 134/73 (BP Location: Left Arm)   Pulse 88   Temp 98.8 F (37.1 C) (Oral)   Resp 18   Ht 5\' 7"  (1.702 m)   Wt 120.7 kg (266 lb)   LMP 12/25/2016   SpO2 99%  BMI 41.66 kg/m   Physical Exam  Constitutional: She is oriented to person, place, and time. She appears well-developed and well-nourished. No distress.  HENT:  Head: Normocephalic and atraumatic.  Neck: Normal range of motion.  Cardiovascular: Normal rate, regular rhythm and intact distal pulses.  No murmur heard. Pulmonary/Chest: Effort normal and breath sounds normal. No respiratory distress.  Lymphadenopathy:    She has no cervical adenopathy.  Neurological: She is alert and oriented to person, place, and time. She exhibits normal muscle tone. Coordination normal.  Skin: Skin is warm and dry. There is erythema.  Mild erythema and fluctuance of the right axilla.  Moderate localized edema.  No drainage.  Less than 1 cm slightly erythematous papule to the left lower abdomen with central scabbing.  No surrounding erythema  Nursing note and vitals reviewed.    ED Treatments / Results  Labs (all labs ordered are  listed, but only abnormal results are displayed) Labs Reviewed - No data to display  EKG  EKG Interpretation None       Radiology No results found.  Procedures Procedures (including critical care time)  INCISION AND DRAINAGE Performed by: Maxwell Caul. Consent: Verbal consent obtained. Risks and benefits: risks, benefits and alternatives were discussed Type: abscess  Body area: right axilla  Anesthesia: local infiltration  Incision was made with a #11 scalpel.  Local anesthetic: lidocaine 2 % Anna/ epinephrine  Anesthetic total: 4  ml  Complexity: complex Blunt dissection to break up loculations  Drainage: purulent  Drainage amount: small  Packing material: 1/4 in iodoform gauze  Patient tolerance: Patient tolerated the procedure well with no immediate complications.     Medications Ordered in ED Medications  lidocaine-EPINEPHrine (XYLOCAINE Anna/EPI) 2 %-1:200000 (PF) injection 10 mL (10 mLs Intradermal Given by Other 01/12/17 1121)     Initial Impression / Assessment and Plan / ED Course  I have reviewed the triage vital signs and the nursing notes.  Pertinent labs & imaging results that were available during my care of the patient were reviewed by me and considered in my medical decision making (see chart for details).    I&D of abscess to right axilla.  Small papule to left lower abdominal wall, no indication for I&D.  Patient agrees to warm wet compresses, antibiotics, and recheck and packing removal in 2 days.   Final Clinical Impressions(s) / ED Diagnoses   Final diagnoses:  Abscess of axilla, right    ED Discharge Orders    None       Rosey Bath 01/12/17 2122    Raeford Razor, MD 01/13/17 [phone]

## 2017-01-12 NOTE — ED Triage Notes (Signed)
Patient c/o abscess in right axillary. Patient states that noticed it Monday and it is progressively getting worse. Denies any fevers or drainage. Patient states she was sent here by Urgent Care due to possible infected area in abd. Patients states "I had a bump pop up on my stomach first and I squeezed it but couldn't get anything out. Now I have bruising and a black spot under skin."

## 2017-01-12 NOTE — Discharge Instructions (Signed)
Warm wet soaks an or compresses to the area 2-3 times day.  Return here on Saturday for recheck and packing removal.

## 2017-01-25 ENCOUNTER — Other Ambulatory Visit (HOSPITAL_COMMUNITY)
Admission: RE | Admit: 2017-01-25 | Discharge: 2017-01-25 | Disposition: A | Discharge status: Home / Self Care | Payer: Self-pay | Source: Ambulatory Visit | Attending: Adult Health | Admitting: Adult Health

## 2017-01-25 ENCOUNTER — Ambulatory Visit (INDEPENDENT_AMBULATORY_CARE_PROVIDER_SITE_OTHER): Payer: Medicaid Other | Admitting: Adult Health

## 2017-01-25 ENCOUNTER — Encounter: Payer: Self-pay | Admitting: Adult Health

## 2017-01-25 VITALS — BP 136/80 | HR 78 | Resp 16 | Ht 67.0 in | Wt 270.0 lb

## 2017-01-25 DIAGNOSIS — Z309 Encounter for contraceptive management, unspecified: Secondary | ICD-10-CM

## 2017-01-25 DIAGNOSIS — Z3041 Encounter for surveillance of contraceptive pills: Secondary | ICD-10-CM | POA: Insufficient documentation

## 2017-01-25 DIAGNOSIS — Z01419 Encounter for gynecological examination (general) (routine) without abnormal findings: Secondary | ICD-10-CM | POA: Insufficient documentation

## 2017-01-25 DIAGNOSIS — Z113 Encounter for screening for infections with a predominantly sexual mode of transmission: Secondary | ICD-10-CM | POA: Insufficient documentation

## 2017-01-25 DIAGNOSIS — Z3009 Encounter for other general counseling and advice on contraception: Secondary | ICD-10-CM

## 2017-01-25 DIAGNOSIS — Z1151 Encounter for screening for human papillomavirus (HPV): Secondary | ICD-10-CM | POA: Insufficient documentation

## 2017-01-25 MED ORDER — NORETHIN ACE-ETH ESTRAD-FE 1-20 MG-MCG(24) PO CHEW: CHEWABLE_TABLET | ORAL | 12 refills | Status: DC

## 2017-01-25 NOTE — Progress Notes (Signed)
Patient ID: Sharyn Creamerracey W Beckner, female   DOB: Jun 13, 1980, [age] y.o.   MRN: [id number] History of Present Illness: [name] is a 37 year old white female, married in for well woman gyn exam and pap.She is happy with her OCs, and she still works at [employer].  She has [employer].   Current Medications, Allergies, Past Medical History, Past Surgical History, Family History and Social History were reviewed in [facility] electronic medical record.     Review of Systems: Patient denies any headaches, hearing loss, fatigue, blurred vision, shortness of breath, chest pain, abdominal pain, problems with bowel movements, urination, or intercourse. No joint pain or mood swings.    Physical Exam:BP 136/80 (BP Location: Left Arm, Patient Position: Sitting, Cuff Size: Normal)   Pulse 78   Resp 16   Ht 5\' 7"  (1.702 m)   Wt 270 lb (122.5 kg)   BMI 42.29 kg/m  General:  Well developed, well nourished, no acute distress Skin:  Warm and dry,tan Neck:  Midline trachea, normal thyroid, good ROM, no lymphadenopathy Lungs; Clear to auscultation bilaterally Breast:  No dominant palpable mass, retraction, or nipple discharge Cardiovascular: Regular rate and rhythm Abdomen:  Soft, non tender, no hepatosplenomegaly Pelvic:  External genitalia is normal in appearance, no lesions.  The vagina is normal in appearance. Urethra has no lesions or masses. The cervix is bulbous. Pap with GC/CHL and HPV. Uterus is felt to be normal size, shape, and contour.  No adnexal masses or tenderness noted.Bladder is non tender, no masses felt. Extremities/musculoskeletal:  No swelling or varicosities noted, no clubbing or cyanosis Psych:  No mood changes, alert and cooperative,seems happy PHQ 2 score 0.  Impression: 1. Encounter for gynecological examination with Papanicolaou smear of cervix   2. Encounter for surveillance of contraceptive pills   3. Family planning   4. Screening examination for STD (sexually  transmitted disease)       Plan: Check HIV and RPR Meds ordered this encounter  Medications  . Norethin Ace-Eth Estrad-FE (MIBELAS 24 FE) 1-20 MG-MCG(24) CHEW    Sig: CHEW ONE TABLET BY MOUTH DAILY.    Dispense:  28 tablet    Refill:  12    Order Specific Question:   Supervising Provider    Answer:   Lazaro ArmsEURE, LUTHER H [[id number]]  Physical in 1 year  Pap in 3 if normal

## 2017-01-26 LAB — HIV ANTIBODY (ROUTINE TESTING W REFLEX): HIV SCREEN 4TH GENERATION: NONREACTIVE

## 2017-01-26 LAB — RPR: RPR: NONREACTIVE

## 2017-01-27 LAB — CYTOLOGY - PAP
CHLAMYDIA, DNA PROBE: NEGATIVE
Diagnosis: NEGATIVE
HPV: NOT DETECTED
Neisseria Gonorrhea: NEGATIVE

## 2017-01-30 ENCOUNTER — Telehealth: Payer: Self-pay | Admitting: Adult Health

## 2017-01-30 ENCOUNTER — Encounter: Payer: Self-pay | Admitting: Adult Health

## 2017-01-30 DIAGNOSIS — A599 Trichomoniasis, unspecified: Secondary | ICD-10-CM

## 2017-01-30 HISTORY — DX: Trichomoniasis, unspecified: A59.9

## 2017-01-30 MED ORDER — METRONIDAZOLE 500 MG PO TABS: ORAL_TABLET | ORAL | 0 refills | Status: AC

## 2017-01-30 NOTE — Telephone Encounter (Signed)
Pt aware pap negative for malignancy and HPV and GC/CHL but +trich, will rx flagyl 500 mg # 4, 4 po now for pt, no alcohol and POC 2/11 at 2 pm and called in same rx for partner, Laqueta LindenChris Spivey [birth year] [facility] [place] [phone]

## 2017-01-30 NOTE — Telephone Encounter (Signed)
Left message to call me about pap 

## 2017-02-13 ENCOUNTER — Ambulatory Visit: Payer: Medicaid Other | Admitting: Adult Health

## 2018-01-31 ENCOUNTER — Telehealth: Payer: Self-pay | Admitting: Adult Health

## 2018-01-31 MED ORDER — NORETHIN ACE-ETH ESTRAD-FE 1-20 MG-MCG(24) PO CHEW: CHEWABLE_TABLET | ORAL | 12 refills | Status: DC

## 2018-01-31 NOTE — Telephone Encounter (Signed)
Refilled OC to [facility]

## 2018-01-31 NOTE — Telephone Encounter (Signed)
Patient would like a refill on her bc pills.  She stated that she'll be out next week.  [facility]  [phone]

## 2019-01-07 ENCOUNTER — Ambulatory Visit: Payer: Medicaid Other | Attending: Internal Medicine

## 2019-02-06 ENCOUNTER — Other Ambulatory Visit: Payer: Self-pay | Admitting: Adult Health

## 2019-07-31 ENCOUNTER — Other Ambulatory Visit: Payer: Self-pay | Admitting: Adult Health

## 2020-03-12 ENCOUNTER — Other Ambulatory Visit: Payer: Self-pay | Admitting: Adult Health

## 2020-05-22 ENCOUNTER — Other Ambulatory Visit: Payer: Self-pay | Admitting: Adult Health

## 2020-06-17 ENCOUNTER — Other Ambulatory Visit: Payer: Self-pay | Admitting: Adult Health

## 2020-08-17 ENCOUNTER — Other Ambulatory Visit: Payer: Self-pay | Admitting: Adult Health

## 2020-09-16 ENCOUNTER — Telehealth: Payer: Self-pay | Admitting: Adult Health

## 2020-09-16 ENCOUNTER — Other Ambulatory Visit: Payer: Self-pay | Admitting: Adult Health

## 2020-09-16 NOTE — Telephone Encounter (Signed)
[facility] - need to schedule PAP/physical w/Jennifer Valentina Lucks in November

## 2020-11-04 ENCOUNTER — Other Ambulatory Visit: Payer: Medicaid Other | Admitting: Adult Health
